# Patient Record
Sex: Female | Born: 1970 | Race: White | Hispanic: No | Marital: Married | Smoking: Current every day smoker
Health system: Southern US, Community
[De-identification: ages and names within clinical notes are randomized; demographics above are authoritative.]

## PROBLEM LIST (undated history)

## (undated) DIAGNOSIS — R519 Headache, unspecified: Secondary | ICD-10-CM

## (undated) DIAGNOSIS — R42 Dizziness and giddiness: Secondary | ICD-10-CM

## (undated) DIAGNOSIS — D649 Anemia, unspecified: Secondary | ICD-10-CM

## (undated) DIAGNOSIS — E039 Hypothyroidism, unspecified: Secondary | ICD-10-CM

## (undated) DIAGNOSIS — I471 Supraventricular tachycardia: Secondary | ICD-10-CM

## (undated) DIAGNOSIS — K219 Gastro-esophageal reflux disease without esophagitis: Secondary | ICD-10-CM

## (undated) DIAGNOSIS — N189 Chronic kidney disease, unspecified: Secondary | ICD-10-CM

## (undated) DIAGNOSIS — R51 Headache: Secondary | ICD-10-CM

## (undated) HISTORY — DX: Dizziness and giddiness: R42

## (undated) HISTORY — DX: Supraventricular tachycardia: I47.1

## (undated) HISTORY — PX: LAPAROSCOPY: SHX197

## (undated) HISTORY — PX: APPENDECTOMY: SHX54

## (undated) HISTORY — DX: Chronic kidney disease, unspecified: N18.9

## (undated) HISTORY — DX: Headache, unspecified: R51.9

## (undated) HISTORY — DX: Gastro-esophageal reflux disease without esophagitis: K21.9

## (undated) HISTORY — DX: Headache: R51

## (undated) HISTORY — DX: Hypothyroidism, unspecified: E03.9

## (undated) HISTORY — DX: Anemia, unspecified: D64.9

---

## 2018-10-05 ENCOUNTER — Encounter: Payer: Self-pay | Admitting: Neurology

## 2018-10-14 ENCOUNTER — Encounter: Payer: Self-pay | Admitting: Neurology
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
## 2018-10-18 ENCOUNTER — Ambulatory Visit (INDEPENDENT_AMBULATORY_CARE_PROVIDER_SITE_OTHER): Payer: Federal, State, Local not specified - PPO | Admitting: Neurology

## 2018-10-18 ENCOUNTER — Encounter: Payer: Self-pay | Admitting: Neurology

## 2018-10-18 VITALS — BP 118/66 | HR 66 | Ht 63.5 in | Wt 100.0 lb

## 2018-10-18 DIAGNOSIS — G5139 Clonic hemifacial spasm, unspecified: Secondary | ICD-10-CM | POA: Diagnosis not present

## 2018-10-18 NOTE — Patient Instructions (Signed)
1. We have sent a referral to [facility] for your MRI and they will call you directly to schedule your appt. They are located at [address] If you need to contact them directly please call [phone].   

## 2018-11-04 ENCOUNTER — Ambulatory Visit
Admission: RE | Admit: 2018-11-04 | Discharge: 2018-11-04 | Disposition: A | Discharge status: Home / Self Care | Payer: Federal, State, Local not specified - PPO | Source: Ambulatory Visit | Attending: Neurology | Admitting: Neurology

## 2018-11-04 DIAGNOSIS — G5139 Clonic hemifacial spasm, unspecified: Secondary | ICD-10-CM

## 2018-11-04 MED ORDER — GADOBENATE DIMEGLUMINE 529 MG/ML IV SOLN
9.0000 mL | Freq: Once | INTRAVENOUS | Status: AC | PRN
  Administered 2018-11-04: 9 mL via INTRAVENOUS

## 2018-11-08 ENCOUNTER — Telehealth: Payer: Self-pay | Admitting: Neurology

## 2018-11-08 NOTE — Telephone Encounter (Signed)
-----   Message from Octaviano Batty Tat, DO sent at 11/08/2018  2:24 PM EST ----- I think Dr. Everlena Cooper already told you to give patient results.  If she would like opinion from neurosx re: possible vascular decompression surgery, then can refer, although may need to be [facility] as not sure that we do that surgery here.

## 2018-11-08 NOTE — Telephone Encounter (Signed)
I did confirm with [facility] that they do see patients for this diagnosis if patient decides to proceed with surgery in the future.

## 2018-11-08 NOTE — Telephone Encounter (Signed)
Spoke with patient and made her aware of results. She wants to hold on Neurosurgery referral for now, she states symptoms are not that bothersome. She will call us back if she changes her mind.
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
